# Patient Record
Sex: Female | Born: 1975 | Race: Asian | Hispanic: No | Marital: Single | Smoking: Never smoker
Health system: Southern US, Community
[De-identification: ages and names within clinical notes are randomized; demographics above are authoritative.]

## PROBLEM LIST (undated history)

## (undated) DIAGNOSIS — E079 Disorder of thyroid, unspecified: Secondary | ICD-10-CM

---

## 2006-08-19 ENCOUNTER — Inpatient Hospital Stay: Payer: Self-pay | Admitting: Obstetrics and Gynecology

## 2010-05-02 ENCOUNTER — Emergency Department: Payer: Self-pay | Admitting: Unknown Physician Specialty

## 2012-03-12 IMAGING — CR LEFT MIDDLE FINGER 2+V
1 series · 3 of 3 positions shown · non-contrast
Comparison: none

REASON FOR EXAM: lawn mower accident
COMMENTS:

PROCEDURE:     DXR - DXR FINGER MID 3RD DIGIT LT HAND  - May 02, 2010  [DATE]
RESULT:     Comparison:  None

[Series 1: view not recorded · 0.17mm/px · 3 of 3 slices shown]
[im 1/3]
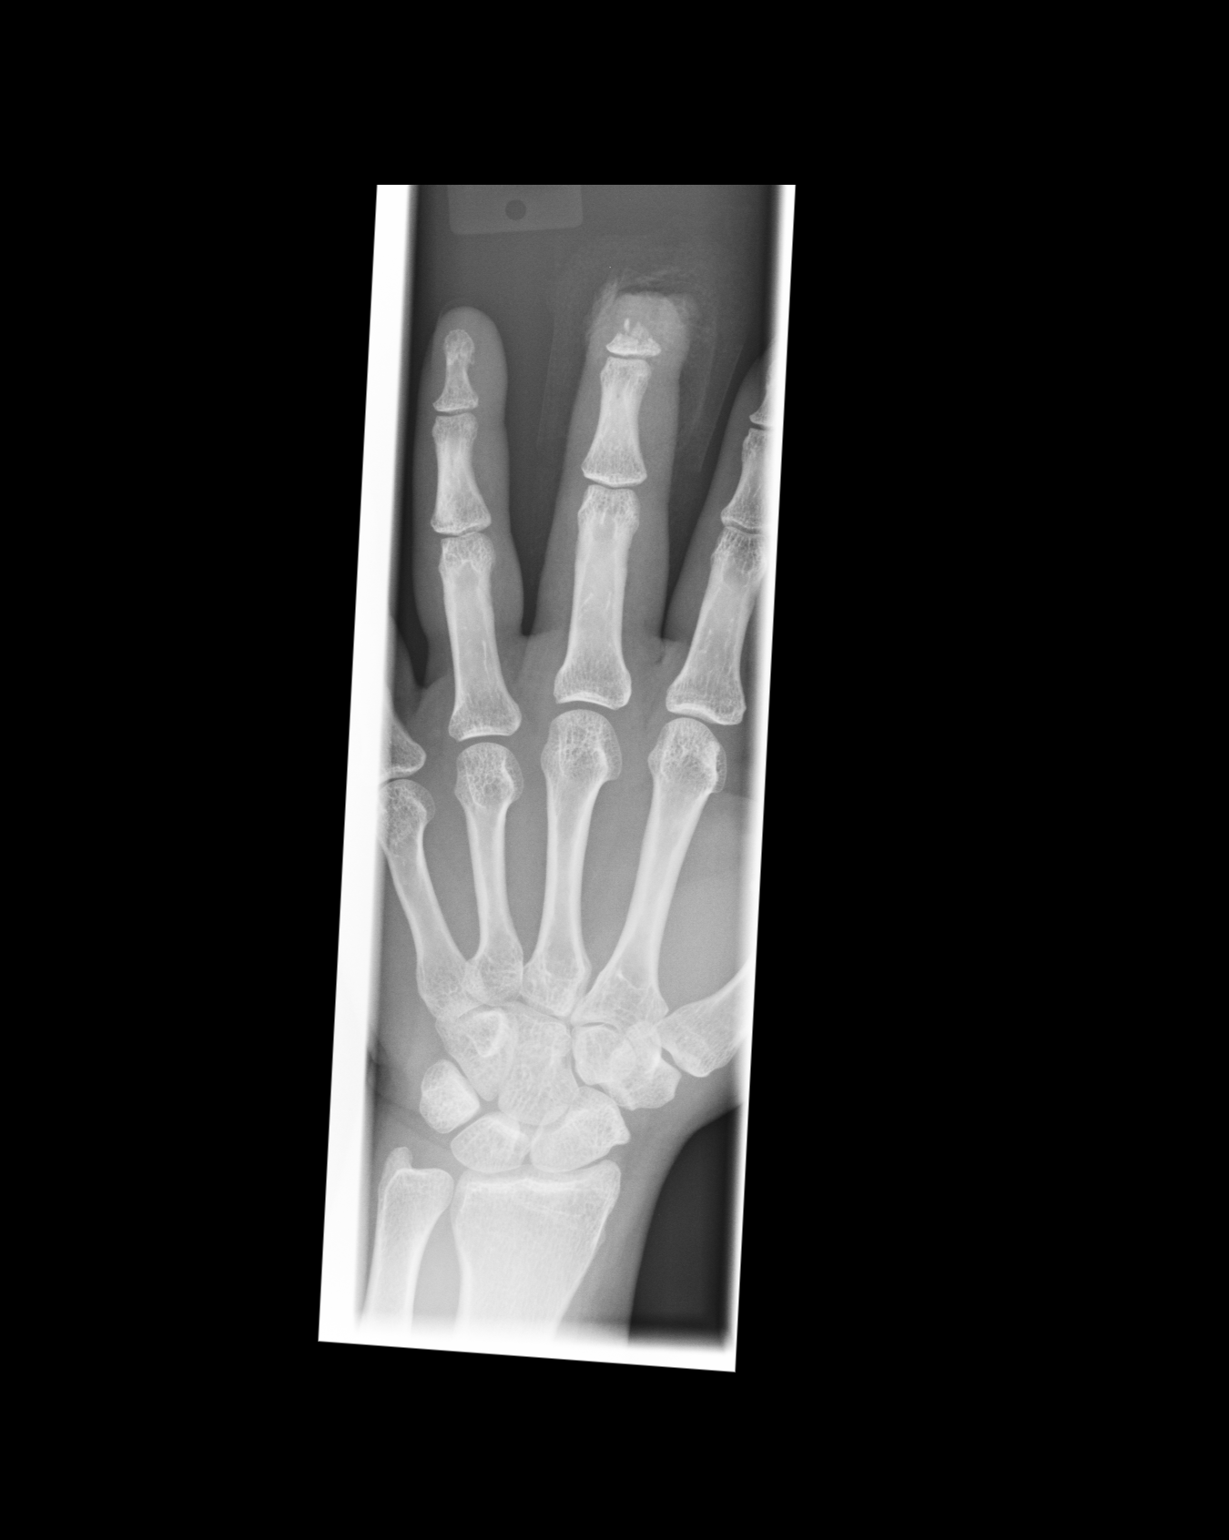
[im 2/3]
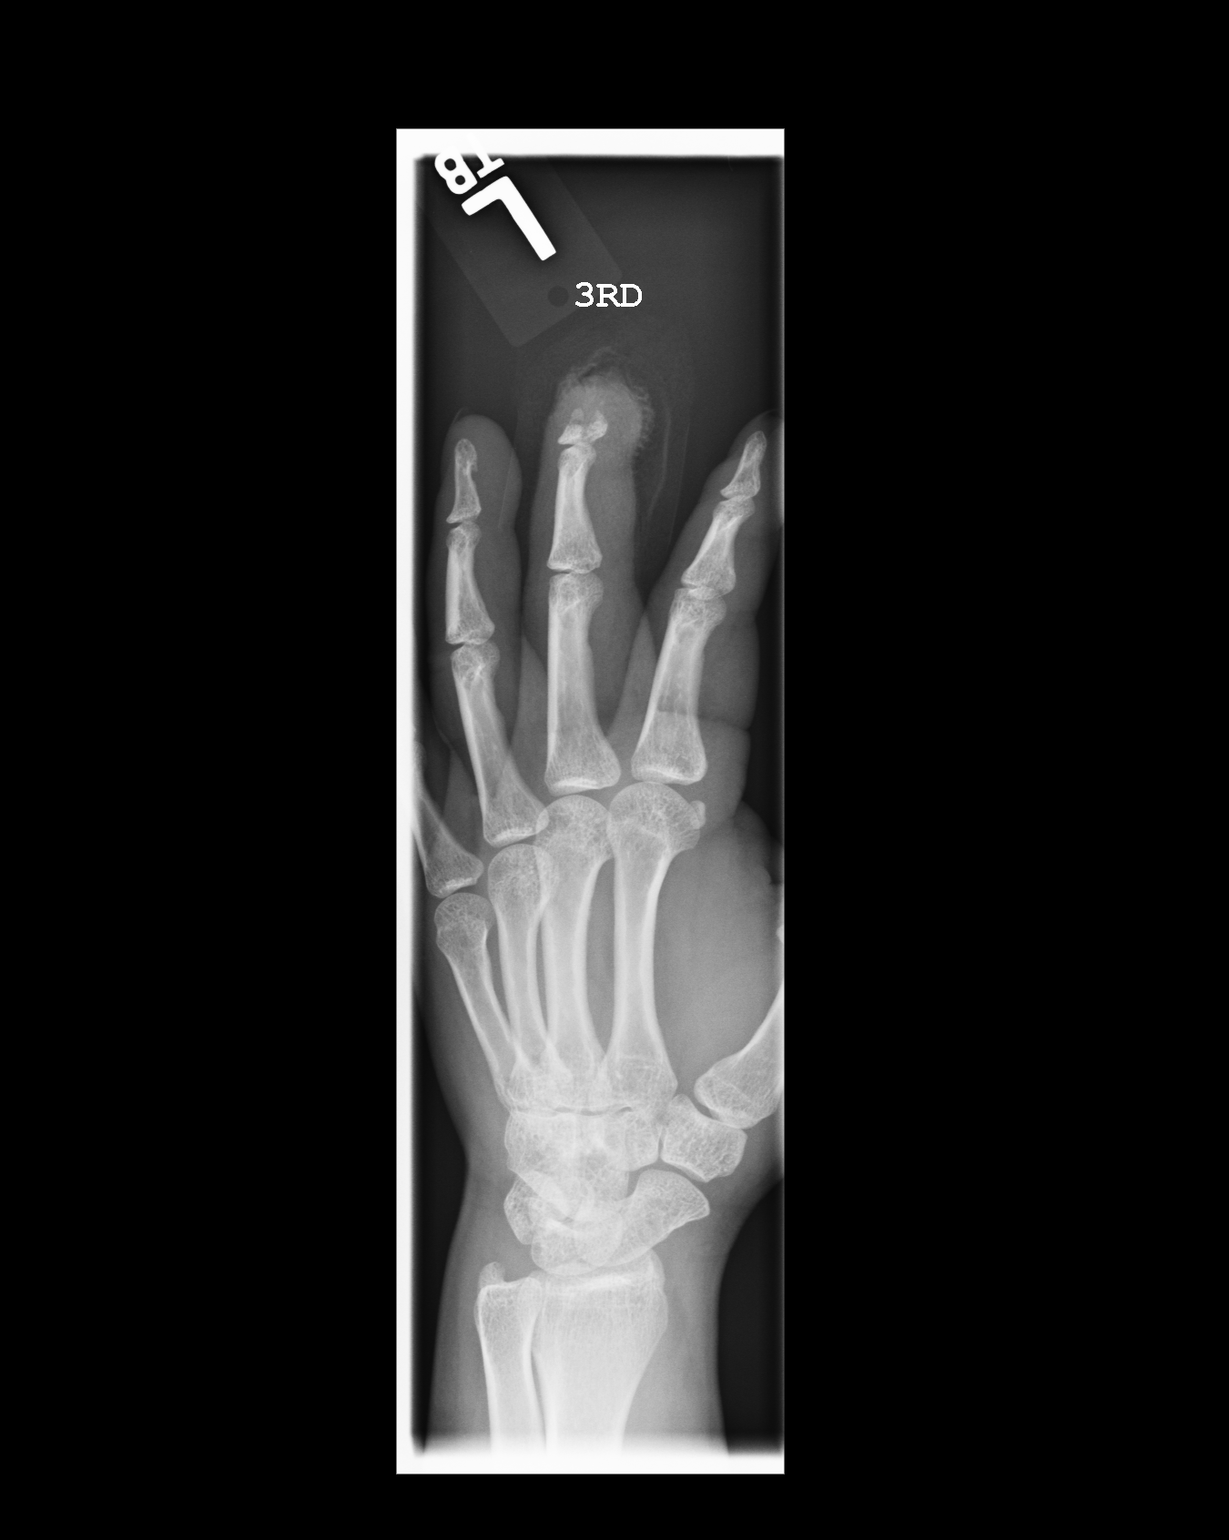
[im 3/3]
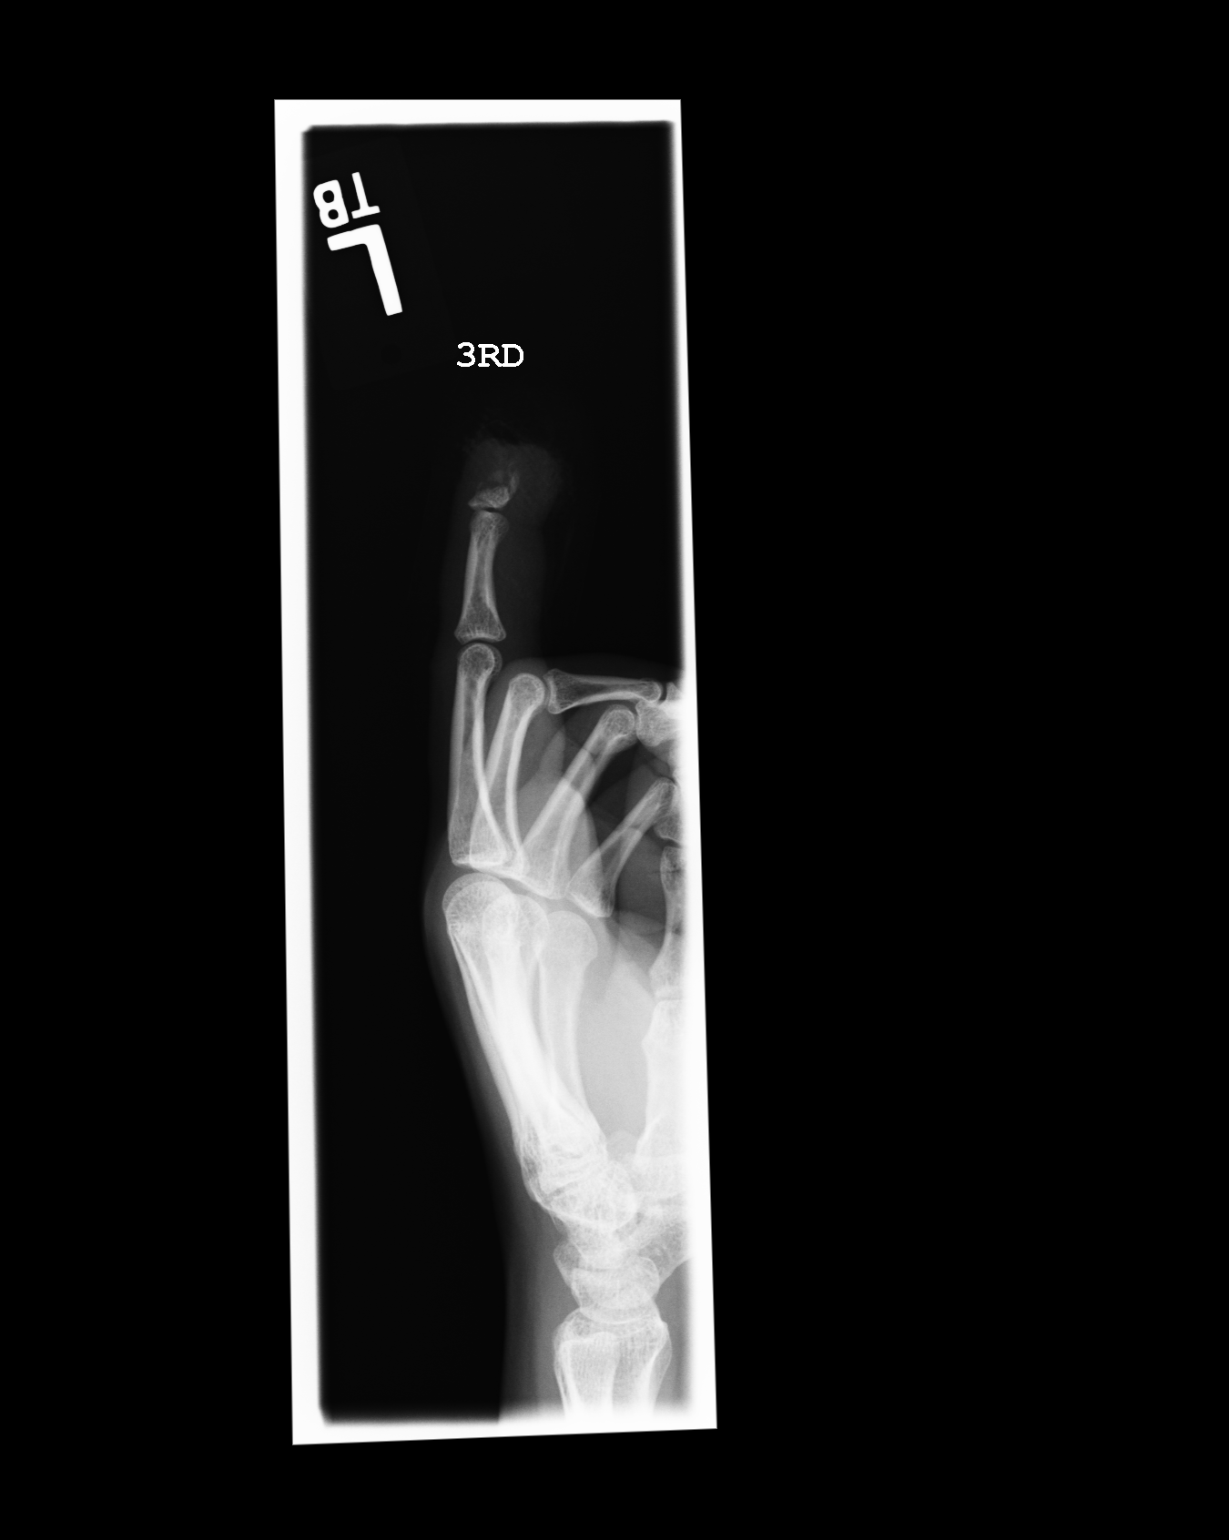

[3 of 3 positions shown; findings below may reference images not displayed]

FINDINGS: Three coned-down views of the left third digit demonstrates indication of
the distal two thirds of the third distal phalanx with multiple comminuted
fracture of the remaining portion of the third proximal phalanx involving
the articular surface. The soft tissues are normal.
IMPRESSION: Please see above.

## 2017-03-23 DIAGNOSIS — M79602 Pain in left arm: Secondary | ICD-10-CM | POA: Insufficient documentation

## 2017-03-23 DIAGNOSIS — G8929 Other chronic pain: Secondary | ICD-10-CM | POA: Insufficient documentation

## 2017-03-23 DIAGNOSIS — B001 Herpesviral vesicular dermatitis: Secondary | ICD-10-CM | POA: Insufficient documentation

## 2017-04-30 DIAGNOSIS — R3129 Other microscopic hematuria: Secondary | ICD-10-CM | POA: Insufficient documentation

## 2017-04-30 DIAGNOSIS — D649 Anemia, unspecified: Secondary | ICD-10-CM | POA: Insufficient documentation

## 2017-04-30 DIAGNOSIS — R7303 Prediabetes: Secondary | ICD-10-CM | POA: Insufficient documentation

## 2018-11-28 ENCOUNTER — Other Ambulatory Visit: Payer: Self-pay | Admitting: Internal Medicine

## 2018-11-28 DIAGNOSIS — Z1231 Encounter for screening mammogram for malignant neoplasm of breast: Secondary | ICD-10-CM

## 2019-05-16 ENCOUNTER — Other Ambulatory Visit: Payer: Self-pay | Admitting: Physician Assistant

## 2019-05-16 DIAGNOSIS — Z3A01 Less than 8 weeks gestation of pregnancy: Secondary | ICD-10-CM

## 2019-05-18 ENCOUNTER — Ambulatory Visit: Payer: Self-pay

## 2019-05-24 ENCOUNTER — Other Ambulatory Visit: Payer: Self-pay | Admitting: Physician Assistant

## 2019-05-24 DIAGNOSIS — O26851 Spotting complicating pregnancy, first trimester: Secondary | ICD-10-CM

## 2019-05-24 DIAGNOSIS — Z3A01 Less than 8 weeks gestation of pregnancy: Secondary | ICD-10-CM

## 2019-05-25 ENCOUNTER — Ambulatory Visit: Admission: RE | Admit: 2019-05-25 | Payer: Self-pay | Source: Ambulatory Visit

## 2019-11-09 DIAGNOSIS — E039 Hypothyroidism, unspecified: Secondary | ICD-10-CM | POA: Insufficient documentation

## 2022-02-04 ENCOUNTER — Ambulatory Visit
Admission: EM | Admit: 2022-02-04 | Discharge: 2022-02-04 | Disposition: A | Discharge status: Home / Self Care | Payer: Self-pay

## 2022-02-04 DIAGNOSIS — R7309 Other abnormal glucose: Secondary | ICD-10-CM | POA: Insufficient documentation

## 2022-02-04 DIAGNOSIS — Z862 Personal history of diseases of the blood and blood-forming organs and certain disorders involving the immune mechanism: Secondary | ICD-10-CM | POA: Insufficient documentation

## 2022-02-04 DIAGNOSIS — Z8639 Personal history of other endocrine, nutritional and metabolic disease: Secondary | ICD-10-CM | POA: Insufficient documentation

## 2022-02-04 DIAGNOSIS — R42 Dizziness and giddiness: Secondary | ICD-10-CM | POA: Insufficient documentation

## 2022-02-04 LAB — GLUCOSE, CAPILLARY: Glucose-Capillary: 149 mg/dL — ABNORMAL HIGH (ref 70–99)

## 2022-02-04 NOTE — ED Triage Notes (Signed)
Patient is here for "Nausea, Dizziness". 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 This was this morning. Some nausea at the time, Then vomiting on the way over here. Nausea remains, Dizziness doesn't "maybe". No recent illness or injury. No chest pain. No sob.  ?

## 2022-02-04 NOTE — ED Provider Notes (Signed)
?[facility] ? ? ? ?CSN: [id number] ?Arrival date & time: 02/04/22  8937 ? ? ?  ? ?History   ?Chief Complaint ?Chief Complaint  ?Patient presents with  ? Nausea  ? Dizziness  ? ? ?HPI ?Kristina Davies is a 46 y.o. female.  ? ?46 year old female presents with dizziness that started at work this morning. 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 She stopped working and sat down. She then proceeded to drive to Urgent Care and vomited on the way. She denies any fever, vision changes, headache, ear pain, nasal congestion, chest pain, or shortness of breath. After waiting at Urgent Care for an hour, she feels much better. Dizziness has resolved. Still occasional nausea but no other vomiting. No syncope. Did not eat or drink anything since last night. Has had occasional dizziness and palpitations in the past. She has been evaluated by Cardiologist with Holter monitor with normal results and no concerning etiology. She does have a history of significant Fe deficiency anemia (last Hgb 11.3 and MCV 60 in 05/2021 but no Fe/Ferritin levels) and was prescribed Rx Fe supplements but does not take them due to side effects (constipation). She also has history of elevated sugar/prediabetes (last HgbAlc 6.0). Told to lose weight and watch diet and increase more whole grains. Other chronic health issues include thyroid disorder. Currently on Synthroid daily. Next PCP visit in August/Sept 2023.  ? ?The history is provided by the patient.  ? ?History reviewed. No pertinent past medical history. ? ?Patient Active Problem List  ? Diagnosis Date Noted  ? Hypothyroidism, acquired 11/09/2019  ? Prediabetes 04/30/2017  ? Hematuria, microscopic 04/30/2017  ? Chronic anemia 04/30/2017  ? Paresthesia and pain of both upper extremities 03/23/2017  ? Herpes labialis without complication 03/23/2017  ? Chronic neck pain 03/23/2017  ? ? ?History reviewed. No pertinent surgical history. ? ?OB History   ?No  obstetric history on file. ?  ? ? ? ?Home Medications   ? ?Prior to Admission medications   ?Medication Sig Start Date End Date Taking? Authorizing Provider  ?ferrous sulfate 325 (65 FE) MG EC tablet Take 1 tablet by mouth daily with breakfast. 06/24/21 06/24/22 Yes [provider]  ?levothyroxine (SYNTHROID) 75 MCG tablet Take by mouth. 02/14/20 02/17/22 Yes [provider]  ? ? ?Family History ?No family history on file. ? ?Social History ?Social History  ? ?Tobacco Use  ? Smoking status: Never  ?  Passive exposure: Never  ? Smokeless tobacco: Never  ?Vaping Use  ? Vaping Use: Never used  ?Substance Use Topics  ? Alcohol use: Not Currently  ? Drug use: Never  ? ? ? ?Allergies   ?Patient has no known allergies. ? ? ?Review of Systems ?Review of Systems  ?Constitutional:  Positive for appetite change and fatigue. Negative for chills, diaphoresis, fever and unexpected weight change.  ?HENT:  Negative for congestion, ear discharge, ear pain, facial swelling, hearing loss, nosebleeds, postnasal drip, rhinorrhea, sinus pressure, sinus pain, sore throat and trouble swallowing.   ?Eyes:  Negative for photophobia and visual disturbance.  ?Respiratory:  Negative for cough, chest tightness and shortness of breath.   ?Cardiovascular:  Negative for chest pain and palpitations.  ?Gastrointestinal:  Positive for nausea and vomiting. Negative for abdominal pain and diarrhea.  ?Musculoskeletal:  Negative for neck pain and neck stiffness.  ?Skin:  Negative for color change and rash.  ?Allergic/Immunologic: Negative for environmental allergies, food allergies and immunocompromised state.  ?  Neurological:  Positive for dizziness and light-headedness. Negative for tremors, seizures, syncope, facial asymmetry, speech difficulty, numbness and headaches.  ?Hematological:  Negative for adenopathy. Does not bruise/bleed easily.  ? ? ?Physical Exam ?Triage Vital Signs ?ED Triage Vitals  ?Enc Vitals Group  ?   BP --   ?   Pulse  Rate 02/04/22 0903 (!) 58  ?   Resp 02/04/22 0903 18  ?   Temp 02/04/22 0903 98 ?F (36.7 ?C)  ?   Temp Source 02/04/22 0903 Oral  ?   SpO2 02/04/22 0903 97 %  ?   Weight 02/04/22 0901 155 lb (70.3 kg)  ?   Height 02/04/22 0901 5\' 2"  (1.575 m)  ?   Head Circumference --   ?   Peak Flow --   ?   Pain Score 02/04/22 0857 0  ?   Pain Loc --   ?   Pain Edu? --   ?   Excl. in [place]? --   ? ?No data found. ? ? ?Updated Vital Signs ?Pulse (!) 58   Temp 98 ?F (36.7 ?C) (Oral)   Resp 18   Ht 5\' 2"  (1.575 m)   Wt 155 lb (70.3 kg)   LMP  (LMP Unknown)   SpO2 97%   BMI 28.35 kg/m?  ? ?Visual Acuity ?Right Eye Distance:   ?Left Eye Distance:   ?Bilateral Distance:   ? ?Right Eye Near:   ?Left Eye Near:    ?Bilateral Near:    ? ?Physical Exam ?Vitals and nursing note reviewed.  ?Constitutional:   ?   General: She is awake. She is not in acute distress. ?   Appearance: She is well-developed and well-groomed.  ?   Comments: She is sitting comfortably on the exam table in no acute distress.   ?HENT:  ?   Head: Normocephalic and atraumatic.  ?   Right Ear: Hearing, tympanic membrane, ear canal and external ear normal.  ?   Left Ear: Hearing, tympanic membrane, ear canal and external ear normal.  ?   Nose: Nose normal. No congestion.  ?   Right Sinus: No maxillary sinus tenderness or frontal sinus tenderness.  ?   Left Sinus: No maxillary sinus tenderness or frontal sinus tenderness.  ?   Mouth/Throat:  ?   Lips: Pink.  ?   Mouth: Mucous membranes are moist.  ?   Pharynx: Oropharynx is clear. Uvula midline. No pharyngeal swelling, oropharyngeal exudate, posterior oropharyngeal erythema or uvula swelling.  ?Eyes:  ?   Extraocular Movements: Extraocular movements intact.  ?   Conjunctiva/sclera: Conjunctivae normal.  ?   Pupils: Pupils are equal, round, and reactive to light.  ?Neck:  ?   Vascular: No carotid bruit.  ?Cardiovascular:  ?   Rate and Rhythm: Normal rate and regular rhythm.  ?   Heart sounds: Normal heart sounds. No  murmur heard. ?   Comments: Heart rate = 64 during exam. Also blood pressure was in the normal range during her visit but somehow is no longer showing up in Epic.  ?Pulmonary:  ?   Effort: Pulmonary effort is normal. No respiratory distress.  ?   Breath sounds: Normal breath sounds and air entry. No decreased air movement. No decreased breath sounds, wheezing, rhonchi or rales.  ?Musculoskeletal:     ?   General: Normal range of motion.  ?   Cervical back: Normal range of motion and neck supple.  ?Lymphadenopathy:  ?   Cervical:  No cervical adenopathy.  ?Skin: ?   General: Skin is warm and dry.  ?   Capillary Refill: Capillary refill takes less than 2 seconds.  ?   Findings: No rash.  ?Neurological:  ?   General: No focal deficit present.  ?   Mental Status: She is alert and oriented to person, place, and time.  ?   Cranial Nerves: Cranial nerves 2-12 are intact.  ?   Sensory: Sensation is intact.  ?   Motor: Motor function is intact.  ?   Coordination: Coordination is intact.  ?   Gait: Gait is intact.  ?   Deep Tendon Reflexes: Reflexes are normal and symmetric.  ?Psychiatric:     ?   Mood and Affect: Mood normal.     ?   Behavior: Behavior normal. Behavior is cooperative.     ?   Thought Content: Thought content normal.     ?   Judgment: Judgment normal.  ? ? ? ?UC Treatments / Results  ?Labs ?(all labs ordered are listed, but only abnormal results are displayed) ?Labs Reviewed  ?GLUCOSE, CAPILLARY - Abnormal; Notable for the following components:  ?    Result Value  ? Glucose-Capillary 149 (*)   ? All other components within normal limits  ?CBG MONITORING, ED  ? ? ?EKG ? ? ?Radiology ?No results found. ? ?Procedures ?Procedures (including critical care time) ? ?Medications Ordered in UC ?Medications - No data to display ? ?Initial Impression / Assessment and Plan / UC Course  ?I have reviewed the triage vital signs and the nursing notes. ? ?Pertinent labs & imaging results that were available during my care of  the patient were reviewed by me and considered in my medical decision making (see chart for details). ? ?  ? ?Reviewed elevated random blood glucose level of 149. This is actually a fasting glucose since she has

## 2022-02-04 NOTE — Discharge Instructions (Addendum)
Recommend continue to monitor symptoms. Since you are feeling better, recommend schedule an appointment with your PCP for further evaluation. Iron deficiency anemia as well as elevated glucose levels can cause dizziness so would recommend further blood work with your PCP. Continue to push water and fluids today and eat small, frequent meals. Try to increase the amount of iron you consume in your diet. Follow-up with your PCP as planned.  ?

## 2022-11-21 ENCOUNTER — Ambulatory Visit
Admission: EM | Admit: 2022-11-21 | Discharge: 2022-11-21 | Disposition: A | Discharge status: Home / Self Care | Payer: 59

## 2022-11-21 DIAGNOSIS — J069 Acute upper respiratory infection, unspecified: Secondary | ICD-10-CM | POA: Diagnosis not present

## 2022-11-21 HISTORY — DX: Disorder of thyroid, unspecified: E07.9

## 2022-11-21 MED ORDER — AMOXICILLIN-POT CLAVULANATE 875-125 MG PO TABS: 1.0000 | ORAL_TABLET | Freq: Two times a day (BID) | ORAL | 0 refills | Status: AC

## 2022-11-21 MED ORDER — IPRATROPIUM BROMIDE 0.06 % NA SOLN: 2.0000 | Freq: Four times a day (QID) | NASAL | 12 refills | Status: AC

## 2022-11-21 MED ORDER — BENZONATATE 100 MG PO CAPS: 200.0000 mg | ORAL_CAPSULE | Freq: Three times a day (TID) | ORAL | 0 refills | Status: AC

## 2022-11-21 MED ORDER — PROMETHAZINE-DM 6.25-15 MG/5ML PO SYRP: 5.0000 mL | ORAL_SOLUTION | Freq: Four times a day (QID) | ORAL | 0 refills | Status: AC | PRN

## 2022-11-21 NOTE — ED Provider Notes (Signed)
[facility]    CSN: [id number] Arrival date & time: 11/21/22  1249      History   Chief Complaint Chief Complaint  Patient presents with   Cough    HPI Kristina Davies is a 47 y.o. female.   HPI  47 year old female here for evaluation of respiratory complaints.  The patient reports that her symptoms began a week ago and they consist of nasal congestion with green nasal discharge, sneezing, cough that is intermittently productive for green sputum.  She denies fever, ear pain, sore throat, shortness of breath, wheezing, or GI complaints.  Past Medical History:  Diagnosis Date   Thyroid disease     Patient Active Problem List   Diagnosis Date Noted   Hypothyroidism, acquired 11/09/2019   Prediabetes 04/30/2017   Hematuria, microscopic 04/30/2017   Chronic anemia 04/30/2017   Paresthesia and pain of both upper extremities 03/23/2017   Herpes labialis without complication [id number]   Chronic neck pain 03/23/2017    History reviewed. No pertinent surgical history.  OB History   No obstetric history on file.      Home Medications    Prior to Admission medications   Medication Sig Start Date End Date Taking? Authorizing Provider  amoxicillin-clavulanate (AUGMENTIN) 875-125 MG tablet Take 1 tablet by mouth every 12 (twelve) hours for 10 days. 11/21/22 12/01/22 Yes Margarette Canada, NP  benzonatate (TESSALON) 100 MG capsule Take 2 capsules (200 mg total) by mouth every 8 (eight) hours. 11/21/22  Yes Margarette Canada, NP  ferrous sulfate 325 (65 FE) MG EC tablet Take 1 tablet by mouth daily with breakfast. 06/24/21 11/21/22 Yes [provider]  ipratropium (ATROVENT) 0.06 % nasal spray Place 2 sprays into both nostrils 4 (four) times daily. 11/21/22  Yes Margarette Canada, NP  levothyroxine (EUTHYROX) 75 MCG tablet Take by mouth. 03/16/22 03/16/23 Yes [provider]  levothyroxine (SYNTHROID) 75 MCG tablet Take by mouth. 02/14/20 11/21/22 Yes [provider]  promethazine-dextromethorphan (PROMETHAZINE-DM) 6.25-15 MG/5ML syrup Take 5 mLs by mouth 4 (four) times daily as needed. 11/21/22  Yes Margarette Canada, NP    Family History History reviewed. No pertinent family history.  Social History Social History   Tobacco Use   Smoking status: Never    Passive exposure: Never   Smokeless tobacco: Never  Vaping Use   Vaping Use: Never used  Substance Use Topics   Alcohol use: Not Currently   Drug use: Never     Allergies   Patient has no known allergies.   Review of Systems Review of Systems  Constitutional:  Negative for fever.  HENT:  Positive for congestion and rhinorrhea. Negative for ear pain and sore throat.   Respiratory:  Positive for cough. Negative for shortness of breath and wheezing.   Gastrointestinal:  Negative for diarrhea, nausea and vomiting.     Physical Exam Triage Vital Signs ED Triage Vitals  Enc Vitals Group     BP      Pulse      Resp      Temp      Temp src      SpO2      Weight      Height      Head Circumference      Peak Flow      Pain Score      Pain Loc      Pain Edu?      Excl. in [place]?    No data found.  Updated Vital Signs BP (!) 135/59 (BP Location: Left Arm)   Pulse 74   Temp 98.4 F (36.9 C) (Oral)   Ht '5\' 2"'$  (1.575 m)   Wt 150 lb (68 kg)   LMP 10/27/2022 (Approximate)   SpO2 99%   BMI 27.44 kg/m   Visual Acuity Right Eye Distance:   Left Eye Distance:   Bilateral Distance:    Right Eye Near:   Left Eye Near:    Bilateral Near:     Physical Exam Vitals and nursing note reviewed.  Constitutional:      Appearance: Normal appearance. She is not ill-appearing.  HENT:     Head: Normocephalic and atraumatic.     Right Ear: Tympanic membrane, ear canal and external ear normal. There is no impacted cerumen.     Left Ear: Tympanic membrane, ear canal and external ear normal. There is no impacted cerumen.     Nose: Congestion and rhinorrhea present.      Comments: Nasal mucosa is erythematous and markedly edematous.  I am unable to visualize the turbinates.  She denies any tenderness to compression of frontal or maxillary sinuses.    Mouth/Throat:     Mouth: Mucous membranes are moist.     Pharynx: Oropharynx is clear. Posterior oropharyngeal erythema present. No oropharyngeal exudate.     Comments: Mild erythema in the posterior pharynx with green postnasal drip. Cardiovascular:     Rate and Rhythm: Normal rate and regular rhythm.     Pulses: Normal pulses.     Heart sounds: Normal heart sounds. No murmur heard.    No friction rub. No gallop.  Pulmonary:     Effort: Pulmonary effort is normal.     Breath sounds: Rhonchi present. No wheezing or rales.  Musculoskeletal:     Cervical back: Normal range of motion and neck supple.  Lymphadenopathy:     Cervical: No cervical adenopathy.  Skin:    General: Skin is warm and dry.     Capillary Refill: Capillary refill takes less than 2 seconds.     Findings: No rash.  Neurological:     General: No focal deficit present.     Mental Status: She is alert and oriented to person, place, and time.      UC Treatments / Results  Labs (all labs ordered are listed, but only abnormal results are displayed) Labs Reviewed - No data to display  EKG   Radiology No results found.  Procedures Procedures (including critical care time)  Medications Ordered in UC Medications - No data to display  Initial Impression / Assessment and Plan / UC Course  I have reviewed the triage vital signs and the nursing notes.  Pertinent labs & imaging results that were available during my care of the patient were reviewed by me and considered in my medical decision making (see chart for details).   Patient is a pleasant, nontoxic-appearing 47 year old female presenting for evaluation of 1 week worth of respiratory symptoms that have not gotten worse but they have also not improved.  She is continue to have  nasal discharge as well as a cough that is intermittently productive.  On exam she has inflamed nasal mucosa but no appreciable discharge though she does have green postnasal drip.  Her sinuses are nontender to compression.  I will treat her for an upper respiratory infection and do a trial of Augmentin twice daily for 10 days for treatment of her URI.  Pulse prescribe Atrovent nasal spray to  help with the congestion and Tessalon Perles and Promethazine DM cough syrup for cough and congestion.  Return precautions reviewed.   Final Clinical Impressions(s) / UC Diagnoses   Final diagnoses:  Upper respiratory tract infection, unspecified type     Discharge Instructions      The Augmentin twice daily with food for 10 days for treatment of your URI.  Perform sinus irrigation 2-3 times a day with a NeilMed sinus rinse kit and distilled water.  Do not use tap water.  You can use plain over-the-counter Mucinex every 6 hours to break up the stickiness of the mucus so your body can clear it.  Increase your oral fluid intake to thin out your mucus so that is also able for your body to clear more easily.  Take an over-the-counter probiotic, such as Culturelle-align-activia, 1 hour after each dose of antibiotic to prevent diarrhea.  Use the Atrovent nasal spray, 2 squirts in each nostril every 6 hours, as needed for runny nose and postnasal drip.  Use the Tessalon Perles every 8 hours during the day.  Take them with a small sip of water.  They may give you some numbness to the base of your tongue or a metallic taste in your mouth, this is normal.  Use the Promethazine DM cough syrup at bedtime for cough and congestion.  It will make you drowsy so do not take it during the day.  If you develop any new or worsening symptoms return for reevaluation or see your primary care provider.      ED Prescriptions     Medication Sig Dispense Auth. Provider   amoxicillin-clavulanate (AUGMENTIN) 875-125 MG  tablet Take 1 tablet by mouth every 12 (twelve) hours for 10 days. 20 tablet Margarette Canada, NP   benzonatate (TESSALON) 100 MG capsule Take 2 capsules (200 mg total) by mouth every 8 (eight) hours. 21 capsule Margarette Canada, NP   ipratropium (ATROVENT) 0.06 % nasal spray Place 2 sprays into both nostrils 4 (four) times daily. 15 mL Margarette Canada, NP   promethazine-dextromethorphan (PROMETHAZINE-DM) 6.25-15 MG/5ML syrup Take 5 mLs by mouth 4 (four) times daily as needed. 118 mL Margarette Canada, NP      PDMP not reviewed this encounter.   Margarette Canada, NP 11/21/22 [phone]

## 2022-11-21 NOTE — Discharge Instructions (Signed)
The Augmentin twice daily with food for 10 days for treatment of your URI. ° °Perform sinus irrigation 2-3 times a day with a [employer]Med sinus rinse kit and distilled water.  Do not use tap water. ° °You can use plain over-the-counter Mucinex every 6 hours to break up the stickiness of the mucus so your body can clear it. ° °Increase your oral fluid intake to thin out your mucus so that is also able for your body to clear more easily. ° °Take an over-the-counter probiotic, such as Culturelle-align-activia, 1 hour after each dose of antibiotic to prevent diarrhea. ° °Use the Atrovent nasal spray, 2 squirts in each nostril every 6 hours, as needed for runny nose and postnasal drip. ° °Use the Tessalon Perles every 8 hours during the day.  Take them with a small sip of water.  They may give you some numbness to the base of your tongue or a metallic taste in your mouth, this is normal. ° °Use the Promethazine DM cough syrup at bedtime for cough and congestion.  It will make you drowsy so do not take it during the day. ° °If you develop any new or worsening symptoms return for reevaluation or see your primary care provider.  °

## 2022-11-21 NOTE — ED Triage Notes (Signed)
Pt c/o cough, sneezing, congestion onset last Saturday. Pt states she has been taking theraflu but cough worse at night

## 2023-06-01 ENCOUNTER — Ambulatory Visit: Payer: 59

## 2023-06-01 DIAGNOSIS — Z1211 Encounter for screening for malignant neoplasm of colon: Secondary | ICD-10-CM | POA: Diagnosis present
